# Patient Record
Sex: Male | Born: 1995 | Race: Black or African American | Hispanic: No | Marital: Single | Smoking: Current every day smoker
Health system: Southern US, Community
[De-identification: ages and names within clinical notes are randomized; demographics above are authoritative.]

## PROBLEM LIST (undated history)

## (undated) HISTORY — PX: ADENOIDECTOMY: SUR15

---
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
## 2016-01-02 NOTE — Telephone Encounter (Signed)
Called pt and notified of recent lab results from visit 7/3 Pt ID'd properly... Reports feeling better and sx have subsided Adv pt to p/u [facility] Pt verb understanding.  Adv pt if sx are not getting better to return  Pt verb understanding  Per Dr. Dayton ScrapeMurray,   Notes Recorded by Eustace MooreLaura W Murray, MD on 12/28/2015 at 7:58 AM Clinical staff, please let patient know that [facility] and throat cx has also returned positive, for non-group A strep.  Will [facility] rx penicillin [facility] [address] [facility]. Recheck as needed. LM

## 2020-01-03 ENCOUNTER — Encounter (HOSPITAL_COMMUNITY): Payer: Self-pay | Admitting: Emergency Medicine

## 2020-01-03 ENCOUNTER — Emergency Department (HOSPITAL_COMMUNITY): Payer: No Typology Code available for payment source

## 2020-01-03 ENCOUNTER — Emergency Department (HOSPITAL_COMMUNITY)
Admission: EM | Admit: 2020-01-03 | Discharge: 2020-01-04 | Disposition: A | Discharge status: Home / Self Care | Payer: No Typology Code available for payment source | Attending: Emergency Medicine | Admitting: Emergency Medicine

## 2020-01-03 DIAGNOSIS — Y929 Unspecified place or not applicable: Secondary | ICD-10-CM | POA: Diagnosis not present

## 2020-01-03 DIAGNOSIS — S00219A Abrasion of unspecified eyelid and periocular area, initial encounter: Secondary | ICD-10-CM | POA: Diagnosis not present

## 2020-01-03 DIAGNOSIS — S0083XA Contusion of other part of head, initial encounter: Secondary | ICD-10-CM | POA: Diagnosis not present

## 2020-01-03 DIAGNOSIS — S80911A Unspecified superficial injury of right knee, initial encounter: Secondary | ICD-10-CM | POA: Insufficient documentation

## 2020-01-03 DIAGNOSIS — Y999 Unspecified external cause status: Secondary | ICD-10-CM | POA: Insufficient documentation

## 2020-01-03 DIAGNOSIS — Z23 Encounter for immunization: Secondary | ICD-10-CM | POA: Insufficient documentation

## 2020-01-03 DIAGNOSIS — S0990XA Unspecified injury of head, initial encounter: Secondary | ICD-10-CM | POA: Diagnosis not present

## 2020-01-03 DIAGNOSIS — S60512A Abrasion of left hand, initial encounter: Secondary | ICD-10-CM | POA: Diagnosis present

## 2020-01-03 DIAGNOSIS — S80211A Abrasion, right knee, initial encounter: Secondary | ICD-10-CM | POA: Diagnosis not present

## 2020-01-03 DIAGNOSIS — Y939 Activity, unspecified: Secondary | ICD-10-CM | POA: Insufficient documentation

## 2020-01-03 DIAGNOSIS — S60922A Unspecified superficial injury of left hand, initial encounter: Secondary | ICD-10-CM | POA: Insufficient documentation

## 2020-01-03 DIAGNOSIS — S81011A Laceration without foreign body, right knee, initial encounter: Secondary | ICD-10-CM | POA: Insufficient documentation

## 2020-01-03 MED ORDER — TETANUS-DIPHTH-ACELL PERTUSSIS 5-2.5-18.5 LF-MCG/0.5 IM SUSP
0.5000 mL | Freq: Once | INTRAMUSCULAR | Status: AC
  Administered 2020-01-03: 0.5 mL via INTRAMUSCULAR
  Filled 2020-01-03: qty 0.5

## 2020-01-03 MED ORDER — SILVER SULFADIAZINE 1 % EX CREA
TOPICAL_CREAM | Freq: Once | CUTANEOUS | Status: AC
  Filled 2020-01-03: qty 85

## 2020-01-03 MED ORDER — OXYCODONE-ACETAMINOPHEN 5-325 MG PO TABS
2.0000 | ORAL_TABLET | Freq: Once | ORAL | Status: AC
  Administered 2020-01-04: 2 via ORAL
  Filled 2020-01-03: qty 2

## 2020-01-03 NOTE — ED Provider Notes (Signed)
Medical screening examination/treatment/procedure(s) were conducted as a shared visit with non-physician practitioner(s) and myself.  I personally evaluated the patient during the encounter.  Apparently patient had been seen by previous providers but not dispositioned. On my exam, already has images completed without significant injuries. Has some soft tissue foreign bodies, will ask nursing to perform wound care and dress wounds.    Allizon Woznick, Barbara Cower, MD 01/04/20 [phone]
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
## 2020-01-04 MED ORDER — OXYCODONE-ACETAMINOPHEN 5-325 MG PO TABS: 2.0000 | ORAL_TABLET | ORAL | 0 refills | Status: AC | PRN

## 2020-01-04 MED ORDER — BACITRACIN-NEOMYCIN-POLYMYXIN 400-5-5000 EX OINT: 1.0000 "application " | TOPICAL_OINTMENT | Freq: Two times a day (BID) | CUTANEOUS | 1 refills | Status: AC

## 2020-01-04 NOTE — ED Notes (Signed)
Wound care complete. Patient has been provided with food and drink

## 2021-03-13 ENCOUNTER — Other Ambulatory Visit: Payer: Self-pay

## 2021-03-13 ENCOUNTER — Emergency Department (HOSPITAL_COMMUNITY)
Admission: EM | Admit: 2021-03-13 | Discharge: 2021-03-13 | Disposition: A | Discharge status: Home / Self Care | Payer: Self-pay | Attending: Emergency Medicine | Admitting: Emergency Medicine

## 2021-03-13 ENCOUNTER — Encounter (HOSPITAL_COMMUNITY): Payer: Self-pay | Admitting: *Deleted

## 2021-03-13 ENCOUNTER — Emergency Department (HOSPITAL_COMMUNITY): Payer: Self-pay

## 2021-03-13 DIAGNOSIS — N201 Calculus of ureter: Secondary | ICD-10-CM | POA: Insufficient documentation

## 2021-03-13 DIAGNOSIS — F17210 Nicotine dependence, cigarettes, uncomplicated: Secondary | ICD-10-CM | POA: Insufficient documentation

## 2021-03-13 LAB — CBC
HCT: 43 % (ref 39.0–52.0)
Hemoglobin: 14.2 g/dL (ref 13.0–17.0)
MCH: 31 pg (ref 26.0–34.0)
MCHC: 33 g/dL (ref 30.0–36.0)
MCV: 93.9 fL (ref 80.0–100.0)
Platelets: 358 10*3/uL (ref 150–400)
RBC: 4.58 MIL/uL (ref 4.22–5.81)
RDW: 12.5 % (ref 11.5–15.5)
WBC: 11 10*3/uL — ABNORMAL HIGH (ref 4.0–10.5)
nRBC: 0 % (ref 0.0–0.2)

## 2021-03-13 LAB — COMPREHENSIVE METABOLIC PANEL
ALT: 18 U/L (ref 0–44)
AST: 20 U/L (ref 15–41)
Albumin: 3.9 g/dL (ref 3.5–5.0)
Alkaline Phosphatase: 47 U/L (ref 38–126)
Anion gap: 9 (ref 5–15)
BUN: 13 mg/dL (ref 6–20)
CO2: 24 mmol/L (ref 22–32)
Calcium: 9.3 mg/dL (ref 8.9–10.3)
Chloride: 105 mmol/L (ref 98–111)
Creatinine, Ser: 1.28 mg/dL — ABNORMAL HIGH (ref 0.61–1.24)
GFR, Estimated: >60 mL/min (ref >60)
Glucose, Bld: 121 mg/dL — ABNORMAL HIGH (ref 70–99)
Potassium: 3.3 mmol/L — ABNORMAL LOW (ref 3.5–5.1)
Sodium: 138 mmol/L (ref 135–145)
Total Bilirubin: 0.9 mg/dL (ref 0.3–1.2)
Total Protein: 6.6 g/dL (ref 6.5–8.1)

## 2021-03-13 LAB — URINALYSIS, ROUTINE W REFLEX MICROSCOPIC
Bilirubin Urine: NEGATIVE
Glucose, UA: NEGATIVE mg/dL
Ketones, ur: NEGATIVE mg/dL
Leukocytes,Ua: NEGATIVE
Nitrite: NEGATIVE
Protein, ur: NEGATIVE mg/dL
Specific Gravity, Urine: >1.03 — ABNORMAL HIGH (ref 1.005–1.030)
pH: 6 (ref 5.0–8.0)

## 2021-03-13 LAB — URINALYSIS, MICROSCOPIC (REFLEX)
RBC / HPF: >50 RBC/hpf (ref 0–5)
WBC, UA: NONE SEEN WBC/hpf (ref 0–5)

## 2021-03-13 LAB — LIPASE, BLOOD: Lipase: 26 U/L (ref 11–51)

## 2021-03-13 MED ORDER — NAPROXEN 375 MG PO TABS: 375.0000 mg | ORAL_TABLET | Freq: Two times a day (BID) | ORAL | 0 refills | Status: AC

## 2021-03-13 MED ORDER — ONDANSETRON 8 MG PO TBDP: 8.0000 mg | ORAL_TABLET | Freq: Three times a day (TID) | ORAL | 0 refills | Status: AC | PRN

## 2021-03-13 MED ORDER — HYDROCODONE-ACETAMINOPHEN 5-325 MG PO TABS: 1.0000 | ORAL_TABLET | Freq: Four times a day (QID) | ORAL | 0 refills | Status: AC | PRN

## 2021-03-13 MED ORDER — IOHEXOL 350 MG/ML SOLN
70.0000 mL | Freq: Once | INTRAVENOUS | Status: AC | PRN
  Administered 2021-03-13: 70 mL via INTRAVENOUS

## 2021-03-13 MED ORDER — HYDROCODONE-ACETAMINOPHEN 5-325 MG PO TABS
2.0000 | ORAL_TABLET | Freq: Once | ORAL | Status: AC
  Administered 2021-03-13: 2 via ORAL
  Filled 2021-03-13: qty 2

## 2021-03-13 MED ORDER — ONDANSETRON 4 MG PO TBDP
4.0000 mg | ORAL_TABLET | Freq: Once | ORAL | Status: AC | PRN
  Administered 2021-03-13: 4 mg via ORAL
  Filled 2021-03-13: qty 1

## 2021-03-13 NOTE — ED Provider Notes (Signed)
[facility] EMERGENCY DEPARTMENT Provider Note   CSN: [id number] Arrival date & time: 03/13/21  7628     History Chief Complaint  Patient presents with   Abdominal Pain    Brad Mitchell is a 25 y.o. male.   Abdominal Pain  Patient presents with acute onset of left lower abdominal pain.  Patient states the symptoms started early this morning and it woke him up.  The pain was intense and severe.  It was in the left lower abdomen rating somewhat to the groin.  He did have an episode of nausea vomiting.  No fevers or chills.  No previous episodes of similar symptoms.  No recent trauma.  Patient was given medication for pain while waiting to be evaluated.  He says his pain is all resolved now  History reviewed. No pertinent past medical history.  There are no problems to display for this patient.   Past Surgical History:  Procedure Laterality Date   ADENOIDECTOMY         No family history on file.  Social History   Tobacco Use   Smoking status: Every Day    Types: Cigarettes  Substance Use Topics   Alcohol use: Yes   Drug use: Not Currently    Home Medications Prior to Admission medications   Medication Sig Start Date End Date Taking? Authorizing Provider  HYDROcodone-acetaminophen (NORCO/VICODIN) 5-325 MG tablet Take 1 tablet by mouth every 6 (six) hours as needed. 03/13/21  Yes Linwood Dibbles, MD  naproxen (NAPROSYN) 375 MG tablet Take 1 tablet (375 mg total) by mouth 2 (two) times daily. 03/13/21  Yes Linwood Dibbles, MD  neomycin-bacitracin-polymyxin (NEOSPORIN) ointment Apply 1 application topically every 12 (twelve) hours. 01/04/20   Mesner, Barbara Cower, MD  ondansetron (ZOFRAN ODT) 8 MG disintegrating tablet Take 1 tablet (8 mg total) by mouth every 8 (eight) hours as needed for nausea or vomiting. 03/13/21  Yes Linwood Dibbles, MD  oxyCODONE-acetaminophen (PERCOCET) 5-325 MG tablet Take 2 tablets by mouth every 4 (four) hours as needed. 01/04/20   Mesner,  Barbara Cower, MD    Allergies    Patient has no known allergies.  Review of Systems   Review of Systems  Gastrointestinal:  Positive for abdominal pain.  All other systems reviewed and are negative.  Physical Exam Updated Vital Signs BP 116/68 (BP Location: Left Arm)   Pulse 73   Temp 97.7 F (36.5 C) (Oral)   Resp 16   Ht 1.854 m (6\' 1" )   Wt 86.2 kg   SpO2 99%   BMI 25.07 kg/m   Physical Exam Vitals and nursing note reviewed.  Constitutional:      General: He is not in acute distress.    Appearance: He is well-developed.  HENT:     Head: Normocephalic and atraumatic.     Right Ear: External ear normal.     Left Ear: External ear normal.  Eyes:     General: No scleral icterus.       Right eye: No discharge.        Left eye: No discharge.     Conjunctiva/sclera: Conjunctivae normal.  Neck:     Trachea: No tracheal deviation.  Cardiovascular:     Rate and Rhythm: Normal rate and regular rhythm.  Pulmonary:     Effort: Pulmonary effort is normal. No respiratory distress.     Breath sounds: Normal breath sounds. No stridor. No wheezing or rales.  Abdominal:     General: Bowel sounds  are normal. There is no distension.     Palpations: Abdomen is soft.     Tenderness: There is no abdominal tenderness. There is no guarding or rebound.  Musculoskeletal:        General: No tenderness or deformity.     Cervical back: Neck supple.  Skin:    General: Skin is warm and dry.     Findings: No rash.  Neurological:     General: No focal deficit present.     Mental Status: He is alert.     Cranial Nerves: No cranial nerve deficit (no facial droop, extraocular movements intact, no slurred speech).     Sensory: No sensory deficit.     Motor: No abnormal muscle tone or seizure activity.     Coordination: Coordination normal.  Psychiatric:        Mood and Affect: Mood normal.    ED Results / Procedures / Treatments   Labs (all labs ordered are listed, but only abnormal results  are displayed) Labs Reviewed  COMPREHENSIVE METABOLIC PANEL - Abnormal; Notable for the following components:      Result Value   Potassium 3.3 (*)    Glucose, Bld 121 (*)    Creatinine, Ser 1.28 (*)    All other components within normal limits  CBC - Abnormal; Notable for the following components:   WBC 11.0 (*)    All other components within normal limits  URINALYSIS, ROUTINE W REFLEX MICROSCOPIC - Abnormal; Notable for the following components:   Specific Gravity, Urine >1.030 (*)    Hgb urine dipstick LARGE (*)    All other components within normal limits  URINALYSIS, MICROSCOPIC (REFLEX) - Abnormal; Notable for the following components:   Bacteria, UA RARE (*)    All other components within normal limits  LIPASE, BLOOD    EKG None  Radiology CT ABDOMEN PELVIS W CONTRAST  Result Date: 03/13/2021 CLINICAL DATA:  Abdominal pain EXAM: CT ABDOMEN AND PELVIS WITH CONTRAST TECHNIQUE: Multidetector CT imaging of the abdomen and pelvis was performed using the standard protocol following bolus administration of intravenous contrast. CONTRAST:  24mL OMNIPAQUE IOHEXOL 350 MG/ML SOLN COMPARISON:  None. FINDINGS: Lower chest: The lung bases are clear. The imaged heart is unremarkable. Hepatobiliary: There is focal fatty infiltration along the falciform ligament in the liver. The liver is otherwise unremarkable. The gallbladder is unremarkable. There is no biliary ductal dilatation. Pancreas: Unremarkable. Spleen: Unremarkable. Adrenals/Urinary Tract: The adrenals are unremarkable. There is a 4 mm stone at the left UVJ with minimal upstream hydroureter but no frank hydronephrosis. The left kidney is mildly hypoenhancing compared to the right. The right kidney is unremarkable. There are no other stones identified. There are no parenchymal lesions in either kidney. The bladder is decompressed but grossly unremarkable. Stomach/Bowel: The stomach is unremarkable. There is no evidence of bowel  obstruction. There is no abnormal bowel wall thickening or inflammatory change. The appendix is normal. Vascular/Lymphatic: The abdominal aorta is normal in course and caliber. The major branch vessels are patent. The main portal and splenic veins are patent. Reproductive: The prostate and seminal vesicles are unremarkable. Other: There is no ascites or free air. Musculoskeletal: The bones are unremarkable. IMPRESSION: 3 mm stone at the left UVJ with minimal upstream hydroureter but no frank hydronephrosis, though the left kidney is mildly hypoenhancing compared to the right. Electronically Signed   By: Lesia Hausen M.D.   On: 03/13/2021 08:10    Procedures Procedures   Medications Ordered in ED  Medications  ondansetron (ZOFRAN-ODT) disintegrating tablet 4 mg (4 mg Oral Given 03/13/21 0639)  HYDROcodone-acetaminophen (NORCO/VICODIN) 5-325 MG per tablet 2 tablet (2 tablets Oral Given 03/13/21 0639)  iohexol (OMNIPAQUE) 350 MG/ML injection 70 mL (70 mLs Intravenous Contrast Given 03/13/21 0800)    ED Course  I have reviewed the triage vital signs and the nursing notes.  Pertinent labs & imaging results that were available during my care of the patient were reviewed by me and considered in my medical decision making (see chart for details).    MDM Rules/Calculators/A&P                           Pt with acute onset llq pain.  No history of prior sx.  ED workup notable for hematuria.  CT scan performed and it does show a 3 or 4 mm left-sided ureteral stone.  Patient's pain has resolved.  No signs of infection.  No signs of renal failure.  Patient appears stable for discharge and outpatient management.  We will give him prescription for pain medications.  Discussed follow-up with urology if symptoms do not resolve and use of a urine strainer Final Clinical Impression(s) / ED Diagnoses Final diagnoses:  Left ureteral stone    Rx / DC Orders ED Discharge Orders          Ordered     HYDROcodone-acetaminophen (NORCO/VICODIN) 5-325 MG tablet  Every 6 hours PRN        03/13/21 1107    naproxen (NAPROSYN) 375 MG tablet  2 times daily        03/13/21 1107    ondansetron (ZOFRAN ODT) 8 MG disintegrating tablet  Every 8 hours PRN        03/13/21 1107             Linwood Dibbles, MD 03/13/21 1114

## 2021-03-13 NOTE — Discharge Instructions (Signed)
Take the medications as needed for pain and nausea.  Use a urine strainer to help determine when you passed the kidney stone.  Follow-up with the urologist if you have persistent symptoms.  Return to the [facility] for severe pain fever or other concerns

## 2021-03-13 NOTE — ED Provider Notes (Signed)
  Emergency Medicine Provider Triage Evaluation Note  Brad Mitchell , a 25 y.o. male  was evaluated in triage.  Pt complains of abdominal pain.  Onset this morning.  Associated vomiting.  Denies fevers.  Denies dysuria.  No treatments PTA.  Review of Systems  Positive: Abdominal pain, n/v Negative: Fever, chills  Physical Exam  BP 130/78 (BP Location: Left Arm)   Pulse 68   Temp 97.7 F (36.5 C) (Oral)   Resp 15   Ht 6\' 1"  (1.854 m)   Wt 86.2 kg   SpO2 100%   BMI 25.07 kg/m  Gen:   Awake, no distress   Resp:  Normal effort  MSK:   Moves extremities without difficulty  Other:  LLQ TTP  Medical Decision Making  Medically screening exam initiated at 6:33 AM.  Appropriate orders placed.  Brad Mitchell was informed that the remainder of the evaluation will be completed by another provider, this initial triage assessment does not replace that evaluation, and the importance of remaining in the ED until their evaluation is complete.  Abdominal pain   Donnita Falls, PA-C 03/13/21 03/15/21    6789, MD 03/14/21 [phone]

## 2021-03-13 NOTE — ED Notes (Signed)
Patient was going to leave and asked for IV to be removed. IV removed. Patient then changed his mind about staying to be seen.

## 2021-03-13 NOTE — ED Triage Notes (Signed)
States he woke with left lower quad pain approx. 30-45 mins ago, went to the bathroom had a bowel movement and pain is worse. C/o nausea no vomiting.
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
## 2022-08-20 ENCOUNTER — Telehealth: Payer: Self-pay

## 2022-08-20 NOTE — Telephone Encounter (Signed)
Mychart msg sent

## 2022-09-18 DIAGNOSIS — Z114 Encounter for screening for human immunodeficiency virus [HIV]: Secondary | ICD-10-CM | POA: Diagnosis not present

## 2022-09-18 DIAGNOSIS — Z113 Encounter for screening for infections with a predominantly sexual mode of transmission: Secondary | ICD-10-CM | POA: Diagnosis not present

## 2023-09-18 DIAGNOSIS — Z20822 Contact with and (suspected) exposure to covid-19: Secondary | ICD-10-CM | POA: Diagnosis not present

## 2023-09-18 DIAGNOSIS — J101 Influenza due to other identified influenza virus with other respiratory manifestations: Secondary | ICD-10-CM | POA: Diagnosis not present

## 2023-09-18 DIAGNOSIS — R509 Fever, unspecified: Secondary | ICD-10-CM | POA: Diagnosis not present

## 2023-10-26 DIAGNOSIS — Z202 Contact with and (suspected) exposure to infections with a predominantly sexual mode of transmission: Secondary | ICD-10-CM | POA: Diagnosis not present

## 2023-12-18 DIAGNOSIS — R3989 Other symptoms and signs involving the genitourinary system: Secondary | ICD-10-CM | POA: Diagnosis not present
